# Patient Record
Sex: Female | Born: 1965 | Race: White | Hispanic: No | Marital: Married | Smoking: Former smoker
Health system: Southern US, Community
[De-identification: ages and names within clinical notes are randomized; demographics above are authoritative.]

## PROBLEM LIST (undated history)

## (undated) HISTORY — PX: APPENDECTOMY: SHX54

## (undated) HISTORY — PX: CHOLECYSTECTOMY: SHX55

## (undated) HISTORY — PX: ABDOMINAL HYSTERECTOMY: SHX81

## (undated) HISTORY — PX: FOOT SURGERY: SHX648

---

## 2015-11-17 DIAGNOSIS — N813 Complete uterovaginal prolapse: Secondary | ICD-10-CM | POA: Insufficient documentation

## 2016-03-15 ENCOUNTER — Ambulatory Visit (INDEPENDENT_AMBULATORY_CARE_PROVIDER_SITE_OTHER): Payer: Commercial Managed Care - PPO | Admitting: Family Medicine

## 2016-03-15 ENCOUNTER — Ambulatory Visit (INDEPENDENT_AMBULATORY_CARE_PROVIDER_SITE_OTHER): Payer: Commercial Managed Care - PPO

## 2016-03-15 ENCOUNTER — Encounter: Payer: Self-pay | Admitting: Family Medicine

## 2016-03-15 VITALS — BP 128/82 | HR 70 | Ht 67.0 in | Wt 220.0 lb

## 2016-03-15 DIAGNOSIS — M25551 Pain in right hip: Secondary | ICD-10-CM | POA: Diagnosis not present

## 2016-03-15 DIAGNOSIS — I951 Orthostatic hypotension: Secondary | ICD-10-CM | POA: Diagnosis not present

## 2016-03-15 DIAGNOSIS — R002 Palpitations: Secondary | ICD-10-CM | POA: Insufficient documentation

## 2016-03-15 LAB — COMPREHENSIVE METABOLIC PANEL
ALBUMIN: 4 g/dL (ref 3.6–5.1)
ALT: 14 U/L (ref 6–29)
AST: 14 U/L (ref 10–35)
Alkaline Phosphatase: 86 U/L (ref 33–115)
BILIRUBIN TOTAL: 0.2 mg/dL (ref 0.2–1.2)
BUN: 11 mg/dL (ref 7–25)
CALCIUM: 8.9 mg/dL (ref 8.6–10.2)
CHLORIDE: 108 mmol/L (ref 98–110)
CO2: 21 mmol/L (ref 20–31)
CREATININE: 0.82 mg/dL (ref 0.50–1.10)
Glucose, Bld: 84 mg/dL (ref 65–99)
Potassium: 4.5 mmol/L (ref 3.5–5.3)
SODIUM: 139 mmol/L (ref 135–146)
TOTAL PROTEIN: 6.9 g/dL (ref 6.1–8.1)

## 2016-03-15 LAB — CBC
HEMATOCRIT: 40.3 % (ref 35.0–45.0)
HEMOGLOBIN: 13.3 g/dL (ref 11.7–15.5)
MCH: 28.9 pg (ref 27.0–33.0)
MCHC: 33 g/dL (ref 32.0–36.0)
MCV: 87.4 fL (ref 80.0–100.0)
MPV: 10 fL (ref 7.5–12.5)
Platelets: 314 10*3/uL (ref 140–400)
RBC: 4.61 MIL/uL (ref 3.80–5.10)
RDW: 14.1 % (ref 11.0–15.0)
WBC: 8.7 10*3/uL (ref 3.8–10.8)

## 2016-03-15 LAB — TSH: TSH: 2.38 mIU/L

## 2016-03-15 LAB — HEMOGLOBIN A1C
HEMOGLOBIN A1C: 5.6 % (ref <5.7)
MEAN PLASMA GLUCOSE: 114 mg/dL

## 2016-03-15 NOTE — Progress Notes (Signed)
       Susan Mcintosh is a 50 y.o. female who [name] [facility] today for establish care and discuss lightheadedness and dizziness. Patient is a 42 history of lightheadedness dizziness and headache. Symptoms occur when she stands. She notes some palpitations but denies any chest pain. No fevers chills vomiting or diarrhea. She measured her blood pressure was 130s over 90s.  Additionally she notes right groin pain. Pain is present for a few months. Pain occurring following hysterectomy. She denies any injury. Pain is worse in the groin with hip flexion and motion. She has difficulty tying her shoes and sometimes difficulty getting in and out of cars. She's tried some over-the-counter medicines which have helped a bit.   History reviewed. No pertinent past medical history. Past Surgical History  Procedure Laterality Date  . Abdominal hysterectomy    . Appendectomy    . Cholecystectomy    . Foot surgery Right    Social History  Substance Use Topics  . Smoking status: Former Games developermoker  . Smokeless tobacco: Not on file  . Alcohol Use: Not on file   family history includes Cancer in her maternal grandfather and paternal uncle; Heart disease in her father; Stroke in her father.  ROS as above: No  visual changes, nausea, vomiting, diarrhea, constipation, dizziness, abdominal pain, skin rash, fevers, chills, night sweats, weight loss, swollen lymph nodes, body aches, joint swelling, muscle aches, chest pain, shortness of breath, mood changes, visual or auditory hallucinations.   Medications: Current Outpatient Prescriptions  Medication Sig Dispense Refill  . cyanocobalamin 1000 MCG tablet Take by mouth.    . Omega-3 Fatty Acids (FISH OIL) 1000 MG CAPS Take by mouth.     No current facility-administered medications for this visit.   Allergies  Allergen Reactions  . Ibuprofen Rash    IV  ROUTE CAN TAKE ORAL   . Sulfamethoxazole Nausea Only and Rash     Exam:  BP 128/82 mmHg  Pulse 70  Ht 5\' 7"  (1.702 m)  Wt 220 lb (99.791 kg)  BMI 34.45 kg/m2  Orthostatic VS for the past 24 hrs:  BP- Lying Pulse- Lying BP- Sitting Pulse- Sitting BP- Standing at 0 minutes Pulse- Standing at 0 minutes  03/15/16 0943 124/72 mmHg 63 128/82 mmHg 70 114/86 mmHg 87      Gen: Well NAD HEENT: EOMI,  MMM Lungs: Normal work of breathing. CTABL Heart: RRR no MRG Abd: NABS, Soft. Nondistended, Nontender Exts: Brisk capillary refill, warm and well perfused.  Hips: Nontender throughout. Right hip motion limited in flexion and internal and external rotation by pain. Left hip motion is normal.   Twelve-lead EKG shows sinus bradycardia at 53 bpm. No ST segment elevation or depression. Aside from bradycardia normal EKG.  No results found for this or any previous visit (from the past 24 hour(s)). No results found.   Please see individual assessment and plan sections.

## 2016-03-15 NOTE — Assessment & Plan Note (Signed)
Likely DJD. Hip x-ray pending. Return in a few weeks.

## 2016-03-15 NOTE — Assessment & Plan Note (Signed)
Holter monitor pending

## 2016-03-15 NOTE — Patient Instructions (Addendum)
Thank you for coming in today. Get labs and xray today.  Return for fasting lab in the future.  Return a week after the holter monitor is done.  You should be contacted about he holter monitor soon.  Call or go to the emergency room if you get worse, have trouble breathing, have chest pains, or palpitations.   Orthostatic Hypotension Orthostatic hypotension is a sudden drop in blood pressure. It happens when you quickly stand up from a seated or lying position. You may feel dizzy or light-headed. This can last for just a few seconds or for up to a few minutes. It is usually not a serious problem. However, if this happens frequently or gets worse, it can be a sign of something more serious. CAUSES  Different things can cause orthostatic hypotension, including:   Loss of body fluids (dehydration).  Medicines that lower blood pressure.  Sudden changes in posture, such as standing up quickly after you have been sitting or lying down.  Taking too much of your medicine. SIGNS AND SYMPTOMS   Light-headedness or dizziness.   Fainting or near-fainting.   A fast heart rate.   Weakness.   Feeling tired (fatigue).  DIAGNOSIS  Your health care provider may do several things to help diagnose your condition and identify the cause. These may include:   Taking a medical history and doing a physical exam.  Checking your blood pressure. Your health care provider will check your blood pressure when you are:  Lying down.  Sitting.  Standing.  Using tilt table testing. In this test, you lie down on a table that moves from a lying position to a standing position. You will be strapped onto the table. This test monitors your blood pressure and heart rate when you are in different positions. TREATMENT  Treatment will vary depending on the cause. Possible treatments include:   Changing the dosage of your medicines.  Wearing compression stockings on your lower legs.  Standing up slowly  after sitting or lying down.  Eating more salt.  Eating frequent, small meals.  In some cases, getting IV fluids.  Taking medicine to enhance fluid retention. HOME CARE INSTRUCTIONS  Only take over-the-counter or prescription medicines as directed by your health care provider.  Follow your health care provider's instructions for changing the dosage of your current medicines.  Do not stop or adjust your medicine on your own.  Stand up slowly after sitting or lying down. This allows your body to adjust to the different position.  Wear compression stockings as directed.  Eat extra salt as directed.  Do not add extra salt to your diet unless directed to by your health care provider.  Eat frequent, small meals.  Avoid standing suddenly after eating.  Avoid hot showers or excessive heat as directed by your health care provider.  Keep all follow-up appointments. SEEK MEDICAL CARE IF:  You continue to feel dizzy or light-headed after standing.  You feel groggy or confused.  You feel cold, clammy, or sick to your stomach (nauseous).  You have blurred vision.  You feel short of breath. SEEK IMMEDIATE MEDICAL CARE IF:   You faint after standing.  You have chest pain.  You have difficulty breathing.   You lose feeling or movement in your arms or legs.   You have slurred speech or difficulty talking, or you are unable to talk.  MAKE SURE YOU:   Understand these instructions.  Will watch your condition.  Will get help right away  if you are not doing well or get worse.   This information is not intended to replace advice given to you by your health care provider. Make sure you discuss any questions you have with your health care provider.   Document Released: 11/15/2002 Document Revised: 11/30/2013 Document Reviewed: 09/17/2013 Elsevier Interactive Patient Education [employer]2016 Elsevier Inc.

## 2016-03-15 NOTE — Assessment & Plan Note (Signed)
Patient has orthostatic vital signs. This likely explains her lightheadedness. Plan for basic laboratory evaluation as well as Holter monitor. Additionally we'll obtain fasting labs. Recheck in a week after Holter monitor is done. Return sooner if needed. Eat salty foods.

## 2016-03-16 LAB — VITAMIN D 25 HYDROXY (VIT D DEFICIENCY, FRACTURES): Vit D, 25-Hydroxy: 41 ng/mL (ref 30–100)

## 2016-03-18 NOTE — Progress Notes (Signed)
Quick Note:  Hip xray does not show a lot of arthritis. We should consider PT or injection. ______

## 2016-03-19 ENCOUNTER — Encounter (HOSPITAL_COMMUNITY): Payer: Self-pay | Admitting: Emergency Medicine

## 2016-03-19 ENCOUNTER — Emergency Department (HOSPITAL_COMMUNITY): Payer: Commercial Managed Care - PPO

## 2016-03-19 ENCOUNTER — Emergency Department (HOSPITAL_COMMUNITY)
Admission: EM | Admit: 2016-03-19 | Discharge: 2016-03-19 | Disposition: A | Discharge status: Home / Self Care | Payer: Commercial Managed Care - PPO | Attending: Emergency Medicine | Admitting: Emergency Medicine

## 2016-03-19 DIAGNOSIS — R110 Nausea: Secondary | ICD-10-CM | POA: Insufficient documentation

## 2016-03-19 DIAGNOSIS — Z87891 Personal history of nicotine dependence: Secondary | ICD-10-CM | POA: Diagnosis not present

## 2016-03-19 DIAGNOSIS — R51 Headache: Secondary | ICD-10-CM | POA: Diagnosis present

## 2016-03-19 DIAGNOSIS — Z79899 Other long term (current) drug therapy: Secondary | ICD-10-CM | POA: Insufficient documentation

## 2016-03-19 DIAGNOSIS — R519 Headache, unspecified: Secondary | ICD-10-CM

## 2016-03-19 LAB — BASIC METABOLIC PANEL WITH GFR
Anion gap: 12 (ref 5–15)
BUN: 8 mg/dL (ref 6–20)
CO2: 20 mmol/L — ABNORMAL LOW (ref 22–32)
Calcium: 9.3 mg/dL (ref 8.9–10.3)
Chloride: 108 mmol/L (ref 101–111)
Creatinine, Ser: 0.74 mg/dL (ref 0.44–1.00)
GFR calc Af Amer: >60 mL/min
GFR calc non Af Amer: >60 mL/min
Glucose, Bld: 93 mg/dL (ref 65–99)
Potassium: 3.8 mmol/L (ref 3.5–5.1)
Sodium: 140 mmol/L (ref 135–145)

## 2016-03-19 LAB — CBC
HEMATOCRIT: 39.6 % (ref 36.0–46.0)
HEMOGLOBIN: 12.9 g/dL (ref 12.0–15.0)
MCH: 28.5 pg (ref 26.0–34.0)
MCHC: 32.6 g/dL (ref 30.0–36.0)
MCV: 87.4 fL (ref 78.0–100.0)
Platelets: 290 10*3/uL (ref 150–400)
RBC: 4.53 MIL/uL (ref 3.87–5.11)
RDW: 13.3 % (ref 11.5–15.5)
WBC: 7.6 10*3/uL (ref 4.0–10.5)

## 2016-03-19 MED ORDER — ONDANSETRON HCL 4 MG/2ML IJ SOLN
4.0000 mg | Freq: Once | INTRAMUSCULAR | Status: AC
  Administered 2016-03-19: 4 mg via INTRAVENOUS
  Filled 2016-03-19: qty 2

## 2016-03-19 MED ORDER — TRAMADOL HCL 50 MG PO TABS: 50.0000 mg | ORAL_TABLET | Freq: Four times a day (QID) | ORAL | Status: DC | PRN

## 2016-03-19 MED ORDER — ONDANSETRON 4 MG PO TBDP: ORAL_TABLET | ORAL | Status: DC

## 2016-03-19 MED ORDER — HYDROMORPHONE HCL 1 MG/ML IJ SOLN
0.5000 mg | Freq: Once | INTRAMUSCULAR | Status: AC
  Administered 2016-03-19: 0.5 mg via INTRAVENOUS
  Filled 2016-03-19: qty 1

## 2016-03-19 NOTE — Discharge Instructions (Signed)
Follow up with your md next week. °

## 2016-03-19 NOTE — ED Provider Notes (Signed)
CSN: [id number]     Arrival date & time 03/19/16  1140 History   First MD Initiated Contact with Patient 03/19/16 1708     Chief Complaint  Patient presents with  . Headache     (Consider location/radiation/quality/duration/timing/severity/associated sxs/prior Treatment) Patient is a 50 y.o. female presenting with headaches. The history is provided by the patient (Patient complains of a headache today at work with some nausea).  Headache Pain location:  Frontal Quality:  Dull Radiates to:  Does not radiate Severity currently:  7/10 Severity at highest:  8/10 Onset quality:  Sudden Timing:  Constant Progression:  Waxing and waning Chronicity:  New Associated symptoms: no abdominal pain, no back pain, no congestion, no cough, no diarrhea, no fatigue, no seizures and no sinus pressure     History reviewed. No pertinent past medical history. Past Surgical History  Procedure Laterality Date  . Abdominal hysterectomy    . Appendectomy    . Cholecystectomy    . Foot surgery Right    Family History  Problem Relation Age of Onset  . Stroke Father   . Heart disease Father   . Cancer Paternal Uncle   . Cancer Maternal Grandfather    Social History  Substance Use Topics  . Smoking status: Former Games developer  . Smokeless tobacco: None  . Alcohol Use: None   OB History    No data available     Review of Systems  Constitutional: Negative for appetite change and fatigue.  HENT: Negative for congestion, ear discharge and sinus pressure.   Eyes: Negative for discharge.  Respiratory: Negative for cough.   Cardiovascular: Negative for chest pain.  Gastrointestinal: Negative for abdominal pain and diarrhea.  Genitourinary: Negative for frequency and hematuria.  Musculoskeletal: Negative for back pain.  Skin: Negative for rash.  Neurological: Positive for headaches. Negative for seizures.  Psychiatric/Behavioral: Negative for hallucinations.      Allergies  Ibuprofen and  Sulfamethoxazole  Home Medications   Prior to Admission medications   Medication Sig Start Date End Date Taking? Authorizing Provider  acetaminophen (TYLENOL) 500 MG tablet Take 1,000 mg by mouth every 6 (six) hours as needed for mild pain.   Yes Historical Provider, MD  cyanocobalamin 1000 MCG tablet Take 500 mcg by mouth daily.    Yes Historical Provider, MD  Omega-3 Fatty Acids (FISH OIL) 1000 MG CAPS Take 1 capsule by mouth daily.    Yes Historical Provider, MD  ondansetron (ZOFRAN ODT) 4 MG disintegrating tablet  ODT q4 hours prn nausea/vomit 03/19/16   Bethann Berkshire, MD  traMADol (ULTRAM) 50 MG tablet Take 1 tablet (50 mg total) by mouth every 6 (six) hours as needed. 03/19/16   Bethann Berkshire, MD   BP 127/85 mmHg  Pulse 60  Resp 16  Ht  (1.702 m)  Wt 220 lb (99.791 kg)  BMI 34.45 kg/m2  SpO2 98% Physical Exam  Constitutional: She is oriented to person, place, and time. She appears well-developed.  HENT:  Head: Normocephalic.  Eyes: Conjunctivae and EOM are normal. No scleral icterus.  Neck: Neck supple. No thyromegaly present.  Cardiovascular: Normal rate and regular rhythm.  Exam reveals no gallop and no friction rub.   No murmur heard. Pulmonary/Chest: No stridor. She has no wheezes. She has no rales. She exhibits no tenderness.  Abdominal: She exhibits no distension. There is no tenderness. There is no rebound.  Musculoskeletal: Normal range of motion. She exhibits no edema.  Lymphadenopathy:    She has no  cervical adenopathy.  Neurological: She is oriented to person, place, and time. She exhibits normal muscle tone. Coordination normal.  Skin: No rash noted. No erythema.  Psychiatric: She has a normal mood and affect. Her behavior is normal.    ED Course  Procedures (including critical care time) Labs Review Labs Reviewed  BASIC METABOLIC PANEL - Abnormal; Notable for the following:    CO2 20 (*)    All other components within normal limits  CBC     Imaging Review Ct Head Wo Contrast  03/19/2016  CLINICAL DATA:  Headache for 2 weeks with neck pain EXAM: CT HEAD WITHOUT CONTRAST CT CERVICAL SPINE WITHOUT CONTRAST TECHNIQUE: Multidetector CT imaging of the head and cervical spine was performed following the standard protocol without intravenous contrast. Multiplanar CT image reconstructions of the cervical spine were also generated. COMPARISON:  None. FINDINGS: CT HEAD FINDINGS There is no evidence of mass effect, midline shift or extra-axial fluid collections. There is no evidence of a space-occupying lesion or intracranial hemorrhage. There is no evidence of a cortical-based area of acute infarction. The ventricles and sulci are appropriate for the patient's age. The basal cisterns are patent. Visualized portions of the orbits are unremarkable. The visualized portions of the paranasal sinuses and mastoid air cells are unremarkable. The osseous structures are unremarkable. CT CERVICAL SPINE FINDINGS The alignment is anatomic. The vertebral body heights are maintained. There is no acute fracture. There is no static listhesis. The prevertebral soft tissues are normal. The intraspinal soft tissues are not fully imaged on this examination due to poor soft tissue contrast, but there is no gross soft tissue abnormality. There is an accessory ossicle adjacent to the tip of the odontoid. There is degenerative disc disease with disc height loss at C4-5, C5-6 and C6-7. There bilateral uncovertebral degenerative changes at C4-5 and C5-6. There is a mild broad-based disc bulge C6-7. The visualized portions of the lung apices demonstrate no focal abnormality. IMPRESSION: 1. No acute intracranial pathology. 2. No acute osseous injury of the cervical spine. Electronically Signed   By: Elige KoHetal  Patel   On: 03/19/2016 20:05   Ct Cervical Spine Wo Contrast  03/19/2016  CLINICAL DATA:  Headache for 2 weeks with neck pain EXAM: CT HEAD WITHOUT CONTRAST CT CERVICAL SPINE  WITHOUT CONTRAST TECHNIQUE: Multidetector CT imaging of the head and cervical spine was performed following the standard protocol without intravenous contrast. Multiplanar CT image reconstructions of the cervical spine were also generated. COMPARISON:  None. FINDINGS: CT HEAD FINDINGS There is no evidence of mass effect, midline shift or extra-axial fluid collections. There is no evidence of a space-occupying lesion or intracranial hemorrhage. There is no evidence of a cortical-based area of acute infarction. The ventricles and sulci are appropriate for the patient's age. The basal cisterns are patent. Visualized portions of the orbits are unremarkable. The visualized portions of the paranasal sinuses and mastoid air cells are unremarkable. The osseous structures are unremarkable. CT CERVICAL SPINE FINDINGS The alignment is anatomic. The vertebral body heights are maintained. There is no acute fracture. There is no static listhesis. The prevertebral soft tissues are normal. The intraspinal soft tissues are not fully imaged on this examination due to poor soft tissue contrast, but there is no gross soft tissue abnormality. There is an accessory ossicle adjacent to the tip of the odontoid. There is degenerative disc disease with disc height loss at C4-5, C5-6 and C6-7. There bilateral uncovertebral degenerative changes at C4-5 and C5-6. There is a mild  broad-based disc bulge C6-7. The visualized portions of the lung apices demonstrate no focal abnormality. IMPRESSION: 1. No acute intracranial pathology. 2. No acute osseous injury of the cervical spine. Electronically Signed   By: Elige Ko   On: 03/19/2016 20:05   I have personally reviewed and evaluated these images and lab results as part of my medical decision-making.   EKG Interpretation None      MDM   Final diagnoses:  Headache behind the eyes   Patient with a headache today that seems to be improving now. Labs and CT scan are negative.      Bethann Berkshire, MD 03/19/16 2035

## 2016-03-19 NOTE — ED Notes (Signed)
Pt states she was at work and started feeling dizzy like she was going to pass out and she had a severe headache. Pt states her bp is elevated, but no hx htn

## 2016-03-19 NOTE — Progress Notes (Signed)
Quick Note:  Normal, no changes. ______ 

## 2016-03-19 NOTE — ED Notes (Signed)
Pt verbalized understanding of d/c instructions and has no further questions. Pt stable and NAD. BP has remained WNL the entire visit.

## 2016-03-26 ENCOUNTER — Ambulatory Visit (INDEPENDENT_AMBULATORY_CARE_PROVIDER_SITE_OTHER): Payer: Commercial Managed Care - PPO

## 2016-03-26 DIAGNOSIS — R002 Palpitations: Secondary | ICD-10-CM | POA: Diagnosis not present

## 2016-04-05 ENCOUNTER — Ambulatory Visit (INDEPENDENT_AMBULATORY_CARE_PROVIDER_SITE_OTHER): Payer: Commercial Managed Care - PPO | Admitting: Family Medicine

## 2016-04-05 ENCOUNTER — Encounter: Payer: Self-pay | Admitting: Family Medicine

## 2016-04-05 VITALS — BP 122/86 | HR 80 | Wt 224.0 lb

## 2016-04-05 DIAGNOSIS — R002 Palpitations: Secondary | ICD-10-CM | POA: Diagnosis not present

## 2016-04-05 DIAGNOSIS — M542 Cervicalgia: Secondary | ICD-10-CM

## 2016-04-05 DIAGNOSIS — I951 Orthostatic hypotension: Secondary | ICD-10-CM | POA: Diagnosis not present

## 2016-04-05 DIAGNOSIS — F339 Major depressive disorder, recurrent, unspecified: Secondary | ICD-10-CM | POA: Insufficient documentation

## 2016-04-05 DIAGNOSIS — F331 Major depressive disorder, recurrent, moderate: Secondary | ICD-10-CM | POA: Diagnosis not present

## 2016-04-05 LAB — COMPREHENSIVE METABOLIC PANEL
ALBUMIN: 4 g/dL (ref 3.6–5.1)
ALT: 29 U/L (ref 6–29)
AST: 25 U/L (ref 10–35)
Alkaline Phosphatase: 101 U/L (ref 33–115)
BUN: 13 mg/dL (ref 7–25)
CALCIUM: 9 mg/dL (ref 8.6–10.2)
CHLORIDE: 105 mmol/L (ref 98–110)
CO2: 21 mmol/L (ref 20–31)
CREATININE: 0.8 mg/dL (ref 0.50–1.10)
Glucose, Bld: 99 mg/dL (ref 65–99)
POTASSIUM: 4.6 mmol/L (ref 3.5–5.3)
Sodium: 140 mmol/L (ref 135–146)
TOTAL PROTEIN: 7 g/dL (ref 6.1–8.1)
Total Bilirubin: 0.3 mg/dL (ref 0.2–1.2)

## 2016-04-05 LAB — CBC
HEMATOCRIT: 38.8 % (ref 35.0–45.0)
HEMOGLOBIN: 12.9 g/dL (ref 11.7–15.5)
MCH: 29 pg (ref 27.0–33.0)
MCHC: 33.2 g/dL (ref 32.0–36.0)
MCV: 87.2 fL (ref 80.0–100.0)
MPV: 9 fL (ref 7.5–12.5)
Platelets: 266 10*3/uL (ref 140–400)
RBC: 4.45 MIL/uL (ref 3.80–5.10)
RDW: 13.9 % (ref 11.0–15.0)
WBC: 7 10*3/uL (ref 3.8–10.8)

## 2016-04-05 LAB — TSH: TSH: 2 mIU/L

## 2016-04-05 LAB — LUTEINIZING HORMONE: LH: 31.1 m[IU]/mL

## 2016-04-05 LAB — T3, FREE: T3 FREE: 2.9 pg/mL (ref 2.3–4.2)

## 2016-04-05 LAB — PROGESTERONE

## 2016-04-05 LAB — FOLLICLE STIMULATING HORMONE: FSH: 44 m[IU]/mL

## 2016-04-05 LAB — T4, FREE: FREE T4: 0.9 ng/dL (ref 0.8–1.8)

## 2016-04-05 MED ORDER — FLUOXETINE HCL 10 MG PO CAPS: 10.0000 mg | ORAL_CAPSULE | Freq: Every day | ORAL | Status: DC

## 2016-04-05 NOTE — Assessment & Plan Note (Signed)
Patient has flushed feelings with palpitations and some orthostasis. Her monitor is pending. This may be menopausal or perimenopausal symptoms. Check estrogen progesterone FSH and LH. Additionally check thyroid labs.

## 2016-04-05 NOTE — Progress Notes (Signed)
       Susan Mcintosh is a 50 y.o. female who [name] [facility] Care today for follow-up palpitations. Patient was seen recently for palpitations/feeling flushed. She had an outpatient Holter monitor which is been done but the results are not back yet. She notes daily palpitations. No fevers or chills vomiting or diarrhea. She does note worsening affect and mood. She notes feeling down and depressed or hopeless and trouble falling asleep feeling bad about herself and as though she is a failure and difficulty concentrating. She notes as though has some overeating as well. She notes the past she had depression and was treated with Prozac and tolerated it well. Patient notes neck soreness and pain. She has pain in her posterior neck and spreading to her upper shoulders. Stenosis is worse with work and better with rest. No radiating pain weakness or numbness.   No past medical history on file. Past Surgical History  Procedure Laterality Date  . Abdominal hysterectomy    . Appendectomy    . Cholecystectomy    . Foot surgery Right    Social History  Substance Use Topics  . Smoking status: Former Games developermoker  . Smokeless tobacco: Not on file  . Alcohol Use: Not on file   family history includes Cancer in her maternal grandfather and paternal uncle; Heart disease in her father; Stroke in her father.  ROS as above Medications: Current Outpatient Prescriptions  Medication Sig Dispense Refill  . acetaminophen (TYLENOL) 500 MG tablet Take 1,000 mg by mouth every 6 (six) hours as needed for mild pain.    . cyanocobalamin 1000 MCG tablet Take 500 mcg by mouth daily.     . Omega-3 Fatty Acids (FISH OIL) 1000 MG CAPS Take 1 capsule by mouth daily.     Marland Kitchen. FLUoxetine (PROZAC) 10 MG capsule Take 1 capsule (10 mg total) by mouth daily. 30 capsule 0   No current facility-administered medications for this visit.     Allergies  Allergen Reactions  . Ibuprofen Rash    IV ROUTE CAN TAKE ORAL   . Sulfamethoxazole Nausea Only and Rash     Exam:  BP 122/86 mmHg  Pulse 80  Wt 224 lb (101.606 kg) Gen: Well NAD HEENT: EOMI,  MMM Lungs: Normal work of breathing. CTABL Heart: RRR no MRG Abd: NABS, Soft. Nondistended, Nontender Exts: Brisk capillary refill, warm and well perfused.  Psych: Alert and oriented normal speech thought process and tearful affect.  Spine: Nontender to spinal midline normal neck motion. Extremity strength and sensation are equal and normal bilaterally.  PHQ9: 20 GAD7: 17  No results found for this or any previous visit (from the past 24 hour(s)). No results found.   Please see individual assessment and plan sections.

## 2016-04-05 NOTE — Assessment & Plan Note (Signed)
Patient is displaying symptoms of major depression and anxiety. Start Prozac and recheck in 2 weeks

## 2016-04-05 NOTE — Patient Instructions (Addendum)
Thank you for coming in today. We are getting labs today  Start prozac daily and follow up in 1-2 weeks.  Come back or go to the emergency room if you notice new weakness new numbness problems walking or bowel or bladder problems. We are referring to PT for neck pain.  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

## 2016-04-05 NOTE — Assessment & Plan Note (Signed)
Likely DDD seen on CT scan. Plan for referral to physical therapy and recheck in 2 weeks

## 2016-04-08 NOTE — Progress Notes (Signed)
Quick Note:  Holter monitor shows nothing scary. We will talk about it more at the next visit. ______

## 2016-04-10 LAB — ESTROGENS, TOTAL: ESTROGEN: 245.8 pg/mL

## 2016-04-12 ENCOUNTER — Ambulatory Visit (INDEPENDENT_AMBULATORY_CARE_PROVIDER_SITE_OTHER): Payer: Commercial Managed Care - PPO | Admitting: Family Medicine

## 2016-04-12 ENCOUNTER — Ambulatory Visit: Payer: Commercial Managed Care - PPO | Admitting: Rehabilitative and Restorative Service Providers"

## 2016-04-12 ENCOUNTER — Encounter: Payer: Self-pay | Admitting: Family Medicine

## 2016-04-12 VITALS — BP 140/86 | HR 79 | Wt 223.0 lb

## 2016-04-12 DIAGNOSIS — F331 Major depressive disorder, recurrent, moderate: Secondary | ICD-10-CM | POA: Diagnosis not present

## 2016-04-12 DIAGNOSIS — Z780 Asymptomatic menopausal state: Secondary | ICD-10-CM | POA: Diagnosis not present

## 2016-04-12 DIAGNOSIS — R002 Palpitations: Secondary | ICD-10-CM | POA: Diagnosis not present

## 2016-04-12 MED ORDER — FLUOXETINE HCL 20 MG PO CAPS: 20.0000 mg | ORAL_CAPSULE | Freq: Every day | ORAL | Status: DC

## 2016-04-12 NOTE — Progress Notes (Signed)
Quick Note:  Labs show perimenopause but not full menopause. ______

## 2016-04-12 NOTE — Assessment & Plan Note (Signed)
Normal Holter monitor Symptoms are improving.

## 2016-04-12 NOTE — Assessment & Plan Note (Signed)
Symptoms are likely postmenopausal. Discussed options including hormone replacement therapy. Patient would like to Black cohosh instead.

## 2016-04-12 NOTE — Progress Notes (Signed)
       Susan AlandKristie Mcintosh is a 50 y.o. female who [facility]: Primary Care today for follow-up anxiety and depression. Patient was seen last week and thought to have depression symptoms. She was started on Prozac and notes improvement. She denies any fevers or chills nausea vomiting or diarrhea. Additionally she had a workup for possible menopausal symptoms. This showed relatively low estrogen very low progesterone and elevated LH and FSH levels.  Additionally patient had palpitations and was worked up with a Holter monitor which was normal. She notes her palpitations are slightly improved with Prozac.   No past medical history on file. Past Surgical History  Procedure Laterality Date  . Abdominal hysterectomy    . Appendectomy    . Cholecystectomy    . Foot surgery Right    Social History  Substance Use Topics  . Smoking status: Former Games developermoker  . Smokeless tobacco: Not on file  . Alcohol Use: Not on file   family history includes Cancer in her maternal grandfather and paternal uncle; Heart disease in her father; Stroke in her father.  ROS as above Medications: Current Outpatient Prescriptions  Medication Sig Dispense Refill  . acetaminophen (TYLENOL) 500 MG tablet Take 1,000 mg by mouth every 6 (six) hours as needed for mild pain.    . cyanocobalamin 1000 MCG tablet Take 500 mcg by mouth daily.     Marland Kitchen. FLUoxetine (PROZAC) 20 MG capsule Take 1 capsule (20 mg total) by mouth daily. 30 capsule 0  . Omega-3 Fatty Acids (FISH OIL) 1000 MG CAPS Take 1 capsule by mouth daily.      No current facility-administered medications for this visit.   Allergies  Allergen Reactions  . Ibuprofen Rash    IV ROUTE CAN TAKE ORAL   . Sulfamethoxazole Nausea Only and Rash     Exam:  BP 140/86 mmHg  Pulse 79  Wt 223 lb (101.152 kg) Gen: Well NAD HEENT: EOMI,  MMM Lungs: Normal work of breathing.  CTABL Heart: RRR no MRG Abd: NABS, Soft. Nondistended, Nontender Exts: Brisk capillary refill, warm and well perfused.  Psych: Alert and oriented normal speech thought process and affect.  No results found for this or any previous visit (from the past 24 hour(s)). No results found.   Please see individual assessment and plan sections.

## 2016-04-12 NOTE — Patient Instructions (Signed)
Thank you for coming in today. Increase prozac to 20mg  daily.  Return in 2-3 weeks.   You can use black cohosh for menopause symptoms  Menopause and Herbal Products WHAT IS MENOPAUSE? Menopause is the normal time of life when menstrual periods decrease in frequency and eventually stop completely. This process can take several years for some women. Menopause is complete when you have had an absence of menstruation for a full year since your last menstrual period. It usually occurs between the ages of 6748 and 7655. It is not common for menopause to begin before the age of 48. During menopause, your body stops producing the female hormones estrogen and progesterone. Common symptoms associated with this loss of hormones (vasomotor symptoms) are:  Hot flashes.  Hot flushes.  Night sweats. Other common symptoms and complications of menopause include:  Decrease in sex drive.  Vaginal dryness and thinning of the walls of the vagina. This can make sex painful.  Dryness of the skin and development of wrinkles.  Headaches.  Tiredness.  Irritability.  Memory problems.  Weight gain.  Bladder infections.  Hair growth on the face and chest.  Inability to reproduce offspring (infertility).  Loss of density in the bones (osteoporosis) increasing your risk for breaks (fractures).  Depression.  Hardening and narrowing of the arteries (atherosclerosis). This increases your risk of heart attack and stroke. WHAT TREATMENT OPTIONS ARE AVAILABLE? There are many treatment choices for menopause symptoms. The most common treatment is hormone replacement therapy. Many alternative therapies for menopause are emerging, including the use of herbal products. These supplements can be found in the form of herbs, teas, oils, tinctures, and pills. Common herbal supplements for menopause are made from plants that contain phytoestrogens. Phytoestrogens are compounds that occur naturally in plants and plant  products. They act like estrogen in the body. Foods and herbs that contain phytoestrogens include:  Soy.  Flax seeds.  Red clover.  Ginseng. WHAT MENOPAUSE SYMPTOMS MAY BE HELPED IF I USE HERBAL PRODUCTS?  Vasomotor symptoms. These may be helped by:  Soy. Some studies show that soy may have a moderate benefit for hot flashes.  Black cohosh. There is limited evidence indicating this may be beneficial for hot flashes.  Symptoms that are related to heart and blood vessel disease. These may be helped by soy. Studies have shown that soy can help to lower cholesterol.  Depression. This may be helped by:  St. John's wort. There is limited evidence that shows this may help mild to moderate depression.  Black cohosh. There is evidence that this may help depression and mood swings.  Osteoporosis. Soy may help to decrease bone loss that is associated with menopause and may prevent osteoporosis. Limited evidence indicates that red clover may offer some bone loss protection as well. Other herbal products that are commonly used during menopause lack enough evidence to support their use as a replacement for conventional menopause therapies. These products include evening primrose, ginseng, and red clover. WHAT ARE THE CASES WHEN HERBAL PRODUCTS SHOULD NOT BE USED DURING MENOPAUSE? Do not use herbal products during menopause without your health care provider's approval if:  You are taking medicine.  You have a preexisting liver condition. ARE THERE ANY RISKS IN MY TAKING HERBAL PRODUCTS DURING MENOPAUSE? If you choose to use herbal products to help with symptoms of menopause, keep in mind that:  Different supplements have different and unmeasured amounts of herbal ingredients.  Herbal products are not regulated the same way that medicines are.  Concentrations of herbs may vary depending on the way they are prepared. For example, the concentration may be different in a pill, tea, oil, and  tincture.  Little is known about the risks of using herbal products, particularly the risks of long-term use.  Some herbal supplements can be harmful when combined with certain medicines. Most commonly reported side effects of herbal products are mild. However, if used improperly, many herbal supplements can cause serious problems. Talk to your health care provider before starting any herbal product. If problems develop, stop taking the supplement and let your health care provider know.   This information is not intended to replace advice given to you by your health care provider. Make sure you discuss any questions you have with your health care provider.   Document Released: 05/13/2008 Document Revised: 12/16/2014 Document Reviewed: 05/10/2014 Elsevier Interactive Patient Education 2016 Elsevier Inc.  Myofascial Pain Syndrome and Fibromyalgia Myofascial pain syndrome and fibromyalgia are both pain disorders. This pain may be felt mainly in your muscles.   Myofascial pain syndrome:  Always has trigger points or tender points in the muscle that will cause pain when pressed. The pain may come and go.  Usually affects your neck, upper back, and shoulder areas. The pain often radiates into your arms and hands.  Fibromyalgia:  Has muscle pains and tenderness that come and go.  Is often associated with fatigue and sleep disturbances.  Has trigger points.  Tends to be long-lasting (chronic), but is not life-threatening. Fibromyalgia and myofascial pain are not the same. However, they often occur together. If you have both conditions, each can make the other worse. Both are common and can cause enough pain and fatigue to make day-to-day activities difficult.  CAUSES  The exact causes of fibromyalgia and myofascial pain are not known. People with certain gene types may be more likely to develop fibromyalgia. Some factors can be triggers for both conditions, such as:   Spine  disorders.  Arthritis.  Severe injury (trauma) and other physical stressors.  Being under a lot of stress.  A medical illness. SIGNS AND SYMPTOMS  Fibromyalgia The main symptom of fibromyalgia is widespread pain and tenderness in your muscles. This can vary over time. Pain is sometimes described as stabbing, shooting, or burning. You may have tingling or numbness, too. You may also have sleep problems and fatigue. You may wake up feeling tired and groggy (fibro fog). Other symptoms may include:   Bowel and bladder problems.  Headaches.  Visual problems.  Problems with odors and noises.  Depression or mood changes.  Painful menstrual periods (dysmenorrhea).  Dry skin or eyes. Myofascial pain syndrome Symptoms of myofascial pain syndrome include:   Tight, ropy bands of muscle.   Uncomfortable sensations in muscular areas, such as:  Aching.  Cramping.  Burning.  Numbness.  Tingling.   Muscle weakness.  Trouble moving certain muscles freely (range of motion). DIAGNOSIS  There are no specific tests to diagnose fibromyalgia or myofascial pain syndrome. Both can be hard to diagnose because their symptoms are common in many other conditions. Your health care provider may suspect one or both of these conditions based on your symptoms and medical history. Your health care provider will also do a physical exam.  The key to diagnosing fibromyalgia is having pain, fatigue, and other symptoms for more than three months that cannot be explained by another condition.  The key to diagnosing myofascial pain syndrome is finding trigger points in muscles that are tender and  cause pain elsewhere in your body (referred pain). TREATMENT  Treating fibromyalgia and myofascial pain often requires a team of health care providers. This usually starts with your primary provider and a physical therapist. You may also find it helpful to work with alternative health care providers, such as  massage therapists or acupuncturists. Treatment for fibromyalgia may include medicines. This may include nonsteroidal anti-inflammatory drugs (NSAIDs), along with other medicines.  Treatment for myofascial pain may also include:  NSAIDs.  Cooling and stretching of muscles.  Trigger point injections.  Sound wave (ultrasound) treatments to stimulate muscles. HOME CARE INSTRUCTIONS   Take medicines only as directed by your health care provider.  Exercise as directed by your health care provider or physical therapist.  Try to avoid stressful situations.  Practice relaxation techniques to control your stress. You may want to try:  Biofeedback.  Visual imagery.  Hypnosis.  Muscle relaxation.  Yoga.  Meditation.  Talk to your health care provider about alternative treatments, such as acupuncture or massage treatment.  Maintain a healthy lifestyle. This includes eating a healthy diet and getting enough sleep.  Consider joining a support group.  Do not do activities that stress or strain your muscles. That includes repetitive motions and heavy lifting. SEEK MEDICAL CARE IF:   You have new symptoms.  Your symptoms get worse.  You have side effects from your medicines.  You have trouble sleeping.  Your condition is causing depression or anxiety. FOR MORE INFORMATION   National Fibromyalgia Association: http://www.fmaware.orgwww.fmaware.org  Arthritis Foundation: http://www.arthritis.orgwww.arthritis.org  American Chronic Pain Association: michaeledo.com.CandyDash.co.za   This information is not intended to replace advice given to you by your health care provider. Make sure you discuss any questions you have with your health care provider.   Document Released: 11/25/2005 Document Revised: 12/16/2014 Document Reviewed: 08/31/2014 Elsevier Interactive Patient Education [employer]

## 2016-04-12 NOTE — Assessment & Plan Note (Signed)
Improving but still quite bad. Increase fluoxetine to 20 mg. Recheck in 2-3 weeks.

## 2016-05-03 ENCOUNTER — Encounter: Payer: Self-pay | Admitting: Family Medicine

## 2016-05-03 ENCOUNTER — Ambulatory Visit: Payer: Commercial Managed Care - PPO | Admitting: Osteopathic Medicine

## 2016-05-03 ENCOUNTER — Ambulatory Visit (INDEPENDENT_AMBULATORY_CARE_PROVIDER_SITE_OTHER): Payer: Commercial Managed Care - PPO | Admitting: Family Medicine

## 2016-05-03 VITALS — BP 146/78 | HR 79 | Wt 221.0 lb

## 2016-05-03 DIAGNOSIS — R030 Elevated blood-pressure reading, without diagnosis of hypertension: Secondary | ICD-10-CM

## 2016-05-03 DIAGNOSIS — F325 Major depressive disorder, single episode, in full remission: Secondary | ICD-10-CM | POA: Diagnosis not present

## 2016-05-03 DIAGNOSIS — H9193 Unspecified hearing loss, bilateral: Secondary | ICD-10-CM | POA: Diagnosis not present

## 2016-05-03 DIAGNOSIS — H9190 Unspecified hearing loss, unspecified ear: Secondary | ICD-10-CM | POA: Insufficient documentation

## 2016-05-03 DIAGNOSIS — Z780 Asymptomatic menopausal state: Secondary | ICD-10-CM

## 2016-05-03 DIAGNOSIS — IMO0001 Reserved for inherently not codable concepts without codable children: Secondary | ICD-10-CM

## 2016-05-03 MED ORDER — FLUOXETINE HCL 20 MG PO CAPS: 20.0000 mg | ORAL_CAPSULE | Freq: Every day | ORAL | Status: DC

## 2016-05-03 NOTE — Progress Notes (Signed)
       Susan Mcintosh is a 50 y.o. female [facility]: [facility] today for decreased hearing, follow-up depression, postmenopausal symptoms, elevated blood pressure.  1) depression: Patient is doing much better with Prozac. She's been tolerating 20 mg daily. She notes some difficulty sleeping at night but feels so much better. She denies any SI or HI. She no longer is crying at work and feels as though she is able to concentrate and relax more.  2) decreased hearing: Patient notes gradual onset of decreased hearing bilaterally. She has trouble distinguishing voices from a crowd. She has trouble sometimes motion voices on the television. She's concerned she may need hearing aids.  3) menopausal symptoms: Patient continues to experience very bothersome hot flashes. She is reluctant to consider hormone replacement therapy would like to try black cohosh instead.   No past medical history on file. Past Surgical History  Procedure Laterality Date  . Abdominal hysterectomy    . Appendectomy    . Cholecystectomy    . Foot surgery Right    Social History  Substance Use Topics  . Smoking status: Former Games developermoker  . Smokeless tobacco: Not on file  . Alcohol Use: Not on file   family history includes Cancer in her maternal grandfather and paternal uncle; Heart disease in her father; Stroke in her father.  ROS as above:  Medications: Current Outpatient Prescriptions  Medication Sig Dispense Refill  . acetaminophen (TYLENOL) 500 MG tablet Take 1,000 mg by mouth every 6 (six) hours as needed for mild pain.    . cyanocobalamin 1000 MCG tablet Take 500 mcg by mouth daily.     Marland Kitchen. FLUoxetine (PROZAC) 20 MG capsule Take 1 capsule (20 mg total) by mouth daily. 90 capsule 0  . Omega-3 Fatty Acids (FISH OIL) 1000 MG CAPS Take 1 capsule by mouth daily.      No current  facility-administered medications for this visit.   Allergies  Allergen Reactions  . Ibuprofen Rash    IV ROUTE CAN TAKE ORAL   . Sulfamethoxazole Nausea Only and Rash     Exam:  BP 146/78 mmHg  Pulse 79  Wt 221 lb (100.245 kg) Gen: Well NAD HEENT: EOMI,  MMM Normal tympanic membranes bilaterally Lungs: Normal work of breathing. CTABL Heart: RRR no MRG Abd: NABS, Soft. Nondistended, Nontender Exts: Brisk capillary refill, warm and well perfused.  Psych: Alert and oriented normal speech thought process and affect.  PHQ9 is 6 GAD 7 is 2  No results found for this or any previous visit (from the past 24 hour(s)). No results found.    Assessment and Plan: 50 y.o. female with  1) depression: Much improved. Continue Prozac. Recheck in 3 months.  2) decreased hearing: Possibly sensorineural hearing loss. Referral to audiology  3) postmenopausal symptoms: Trial of black cohosh. Recheck in 3 months. If not better would consider hormone replacement therapy.  4) elevated blood pressure: Patient has no history of hypertension. Will recheck next visit. Patient will keep home blood pressure log.  Discussed warning signs or symptoms. Please see discharge instructions. Patient expresses understanding.

## 2016-05-03 NOTE — Patient Instructions (Signed)
Thank you for coming in today. Continue prozac.  Consider taking Black cohosh. Typically two  tablets twice daily.  If you don't get better we can do hormone replacement.  We will recheck things in 3 months.  Call or go to the emergency room if you get worse, have trouble breathing, have chest pains, or palpitations.    Black Cohosh, Cimicifuga racemosa oral dosage forms What is this medicine? BLACK COHOSH (blak KOH hosh) or Cimicifuga racemosa is a dietary supplement. It is being promoted to help support female health problems, like the symptoms of menopause (hot flashes). Black cohosh is also promoted to ease menstrual pain or pre-menstrual syndrome (PMS). The FDA does not recognize black cohosh as being safe or effective for any use at this time, and warns against its use in pregnancy. This supplement may be used for other purposes; ask your health care provider or pharmacist if you have questions. This medicine may be used for other purposes; ask your health care provider or pharmacist if you have questions. What should I tell my health care provider before I take this medicine? They need to know if you have any of these conditions: -cancer -endometriosis or uterine fibroids -high blood pressure -infertility -kidney disease -liver disease -menstrual changes or irregular periods -unusual vaginal or uterine bleeding -an unusual or allergic reaction to black cohosh, soybeans, tartrazine dye (yellow dye number 5), other medicines, foods, dyes, or preservatives -pregnant or trying to get pregnant -breast-feeding How should I use this medicine? Take this herb by mouth with a glass of water. Follow the directions on the package labeling, or talk to your health care professional. Do not use for longer than 6 months without the advice of a health care professional. Do not use if you are pregnant or breast-feeding. Talk to your obstetrician-gynecologist or certified nurse-midwife. This herb  is not for use in children under the age of 18 years. Overdosage: If you think you have taken too much of this medicine contact a poison control center or emergency room at once. NOTE: This medicine is only for you. Do not share this medicine with others. What if I miss a dose? If you miss a dose, take it as soon as you can. If it is almost time for your next dose, take only that dose. Do not take double or extra doses. What may interact with this medicine? -female hormones, like estrogens or progestins and birth control pills -fertility treatments -medicines for blood pressure -medicines for diabetes This list may not describe all possible interactions. Give your health care provider a list of all the medicines, herbs, non-prescription drugs, or dietary supplements you use. Also tell them if you smoke, drink alcohol, or use illegal drugs. Some items may interact with your medicine. What should I watch for while using this medicine? Since this herb is derived from a plant, allergic reactions are possible. Stop using this herb if you develop a rash. You may need to see your health care professional, or inform them that this occurred. Report any unusual side effects promptly. If you are taking this herb for menstrual or menopausal symptoms, visit your doctor or health care professional for regular checks on your progress. You should have a complete check-up every 6 months. You will need a regular breast and pelvic exam while on this therapy. Follow the advice of your doctor or health care professional. If you have any reason to think you are pregnant, stop taking this herb at once and contact your  doctor or health care professional. Herbal or dietary supplements are not regulated like medicines. Rigid quality control standards are not required for dietary supplements. The purity and strength of these products can vary. The safety and effect of this dietary supplement for a certain disease or illness is  not well known. This product is not intended to diagnose, treat, cure or prevent any disease. The Food and Drug Administration suggests the following to help consumers protect themselves: -Always read product labels and follow directions. -Natural does not mean a product is safe for humans to take. -Look for products that include USP after the ingredient name. This means that the manufacturer followed the standards of the [place] Pharmacopoeia. -Supplements made or sold by a nationally known food or drug company are more likely to be made under tight controls. You can write to the company for more information about how the product was made. What side effects may I notice from receiving this medicine? Side effects that you should report to your doctor or health care professional as soon as possible: -allergic reactions like skin rash, itching or hives, swelling of the face, lips, or tongue -difficulty breathing, shortness of breath, or wheezing -easy bruising -fast heartbeat, slow heartbeat, or palpitations -headache -high blood pressure -severe nausea or vomiting -unusually weak or tired Side effects that usually do not require medical attention (report to your doctor or health care professional if they continue or are bothersome): -heartburn -mild upset stomach This list may not describe all possible side effects. Call your doctor for medical advice about side effects. You may report side effects to FDA at 1-800-FDA-1088. Where should I keep my medicine? Keep out of the reach of children. Store at room temperature between 15 and 30 degrees C (59 and 86 degrees C). Throw away any unused herb after the expiration date. NOTE: This sheet is a summary. It may not cover all possible information. If you have questions about this medicine, talk to your doctor, pharmacist, or health care provider.    2016, Elsevier/Gold Standard. (2008-02-25 13:29:09)

## 2016-07-18 ENCOUNTER — Ambulatory Visit (INDEPENDENT_AMBULATORY_CARE_PROVIDER_SITE_OTHER): Payer: Commercial Managed Care - PPO | Admitting: Family Medicine

## 2016-07-18 VITALS — BP 132/85 | HR 97 | Wt 214.0 lb

## 2016-07-18 DIAGNOSIS — F411 Generalized anxiety disorder: Secondary | ICD-10-CM

## 2016-07-18 DIAGNOSIS — F332 Major depressive disorder, recurrent severe without psychotic features: Secondary | ICD-10-CM

## 2016-07-18 MED ORDER — BUPROPION HCL ER (XL) 150 MG PO TB24: 150.0000 mg | ORAL_TABLET | ORAL | 1 refills | Status: DC

## 2016-07-18 MED ORDER — FLUOXETINE HCL 40 MG PO CAPS: 40.0000 mg | ORAL_CAPSULE | Freq: Every day | ORAL | 0 refills | Status: DC

## 2016-07-18 NOTE — Patient Instructions (Signed)
Thank you for coming in today. Return in 1 week.  Return sooner if needed.  Call me if you get worse.  Increase prozac to 40mg  daily.  Add Wellbutrin.  You should hear about psychiatry and counseling soon  Helping Someone Who is Suicidal Suicide is when someone takes his or her own life. Someone who is thinking about suicide needs immediate help. Although you might not know what to say or do to help, start by letting that person know you care. Listen to him or her. Then talk about how to get help. Help is available through therapy, medicine, and other treatments. WHAT ARE SIGNS THAT SOMEONE IS SUICIDAL? Common signs include:   Signs of depression, such as:  Rage.  Irritability.  Shame.  Excessive worry.  Loss of interest in things the person once enjoyed.  Changes in social behaviors and relationships, including:  Isolating oneself.  Withdrawing from friends and family.  Giving away possessions.  Saying good-bye.  Acting aggressively.  Sleeping more or less than usual.  Having trouble managing school or work.   Talking about feeling hopeless or being a burden.  Engaging in risky behaviors, such as drinking more alcohol or using more drugs. WHAT ARE THE RISK FACTORS FOR SUICIDE? Risk factors for suicide include:   Other suicides in the family.  A history of suicide attempts.  Depression or other mental health issues.  Being in jail or facing jail time.  Having had close friends who have committed suicide.  Alcohol or drug abuse, especially combined with a mental illness.  WHAT SHOULD I DO IF SOMEONE IS SUICIDAL? If you believe a person is in immediate danger of committing suicide, call your local emergency services (911 in the [place] for help. If a person says he or she wants to commit suicide, take the threat seriously. Help the person get help right away by:   Calling your local emergency services.  Calling a suicide prevention hotline.  Contacting  a crisis center or a local suicide prevention center. These are often located at hospitals, clinics, [employer] service organizations, social service providers, or health departments. If a person confides in you that he or she is considering suicide:   Listen to the person's thoughts and concerns with compassion.  Let the person know you will stay with him or her.   Ask if the person is having thoughts of hurting himself or herself.   Offer to help the person get to a doctor or mental health professional.   Remove all weapons and medicines from the person's living space.  Do not promise to keep his or her thoughts of suicide a secret.   This information is not intended to replace advice given to you by your health care provider. Make sure you discuss any questions you have with your health care provider.   Document Released: 06/01/2003 Document Revised: 12/16/2014 Document Reviewed: 05/05/2014 Elsevier Interactive Patient Education [employer]2016 Elsevier Inc.

## 2016-07-19 DIAGNOSIS — F411 Generalized anxiety disorder: Secondary | ICD-10-CM | POA: Insufficient documentation

## 2016-07-19 NOTE — Progress Notes (Signed)
Susan Mcintosh is a 50 y.o. female who presents to [name]: Primary Care Sports Medicine today for new depression and anxiety. Patient has worsening depression and anxiety symptoms of the past several months. She notes profound anhedonia, feeling depressed difficulty with sleep having little energy and difficulty concentrating. She has a passive death wish. She thinks she would take pills to overdose for suicide attempt but has no active plan. Her daughter and granddaughter are what is keeping her from hurting herself. She is very concerned about her suicidal thoughts and is here for health. She continues to take Prozac 20 mg daily.   No past medical history on file. Past Surgical History:  Procedure Laterality Date  . ABDOMINAL HYSTERECTOMY    . APPENDECTOMY    . CHOLECYSTECTOMY    . FOOT SURGERY Right    Social History  Substance Use Topics  . Smoking status: Former Games developer  . Smokeless tobacco: Not on file  . Alcohol use Not on file   family history includes Cancer in her maternal grandfather and paternal uncle; Heart disease in her father; Stroke in her father.  ROS as above:  Medications: Current Outpatient Prescriptions  Medication Sig Dispense Refill  . acetaminophen (TYLENOL) 500 MG tablet Take 1,000 mg by mouth every 6 (six) hours as needed for mild pain.    Marland Kitchen buPROPion (WELLBUTRIN XL) 150 MG 24 hr tablet Take 1 tablet (150 mg total) by mouth every morning. 30 tablet 1  . cyanocobalamin 1000 MCG tablet Take 500 mcg by mouth daily.     Marland Kitchen FLUoxetine (PROZAC) 40 MG capsule Take 1 capsule (40 mg total) by mouth daily. 30 capsule 0  . Omega-3 Fatty Acids (FISH OIL) 1000 MG CAPS Take 1 capsule by mouth daily.      No current facility-administered medications for this visit.    Allergies  Allergen Reactions  . Ibuprofen Rash    IV ROUTE CAN TAKE ORAL   . Sulfamethoxazole Nausea  Only and Rash     Exam:  BP 132/85   Pulse 97   Wt 214 lb (97.1 kg)   BMI 33.52 kg/m  Gen: Well NAD Psych: Alert and oriented affect is tearful. Thought process is linear and goal-directed. Patient has a passive death wish with no homicidal ideation. She agrees to contract for safety today. Depression screen PHQ 2/9 07/19/2016  Decreased Interest 2  Down, Depressed, Hopeless 3  PHQ - 2 Score 5  Altered sleeping 3  Tired, decreased energy 3  Change in appetite 3  Feeling bad or failure about yourself  3  Trouble concentrating 3  Moving slowly or fidgety/restless 1  Suicidal thoughts 3  PHQ-9 Score 24    GAD 7 : Generalized Anxiety Score 07/19/2016  Nervous, Anxious, on Edge 3  Control/stop worrying 3  Worry too much - different things 3  Trouble relaxing 3  Restless 1  Easily annoyed or irritable 2  Afraid - awful might happen 3  Total GAD 7 Score 18  Anxiety Difficulty Very difficult      No results found for this or any previous visit (from the past 24 hour(s)). No results found.    Assessment and Plan: 50 y.o. female with major depression and anxiety worsening. Patient is a passive death wish. Plan for contract for safety. Please see document scanned into chart. Additionally will increase Prozac to 40 mg daily and start Wellbutrin. Refer to counseling and psychiatry. Recheck in 1 week.  Suicidal precautions reviewed.   No orders of the defined types were placed in this encounter.   Discussed warning signs or symptoms. Please see discharge instructions. Patient expresses understanding.

## 2016-07-26 ENCOUNTER — Ambulatory Visit (INDEPENDENT_AMBULATORY_CARE_PROVIDER_SITE_OTHER): Payer: Commercial Managed Care - PPO | Admitting: Family Medicine

## 2016-07-26 ENCOUNTER — Encounter: Payer: Self-pay | Admitting: Family Medicine

## 2016-07-26 VITALS — BP 122/82 | HR 83 | Temp 98.2°F | Resp 16 | Ht 67.5 in | Wt 213.0 lb

## 2016-07-26 DIAGNOSIS — Z23 Encounter for immunization: Secondary | ICD-10-CM

## 2016-07-26 DIAGNOSIS — F332 Major depressive disorder, recurrent severe without psychotic features: Secondary | ICD-10-CM | POA: Diagnosis not present

## 2016-07-26 NOTE — Progress Notes (Signed)
       Susan AlandKristie Mcintosh is a 50 y.o. female who [name] today for follow-up depression. Patient was seen about a week ago for exacerbation of her major depression. She was started on Wellbutrin on her Prozac was increased to 40 mg recheck refer to counseling and psychiatry. She has not heard from counseling psychiatry yet. She notes feeling much better and no longer has any suicidal ideation.   No past medical history on file. Past Surgical History:  Procedure Laterality Date  . ABDOMINAL HYSTERECTOMY    . APPENDECTOMY    . CHOLECYSTECTOMY    . FOOT SURGERY Right    Social History  Substance Use Topics  . Smoking status: Former Games developermoker  . Smokeless tobacco: Not on file  . Alcohol use Not on file   family history includes Cancer in her maternal grandfather and paternal uncle; Heart disease in her father; Stroke in her father.  ROS as above:  Medications: Current Outpatient Prescriptions  Medication Sig Dispense Refill  . acetaminophen (TYLENOL) 500 MG tablet Take 1,000 mg by mouth every 6 (six) hours as needed for mild pain.    Marland Kitchen. buPROPion (WELLBUTRIN XL) 150 MG 24 hr tablet Take 1 tablet (150 mg total) by mouth every morning. 30 tablet 1  . cyanocobalamin 1000 MCG tablet Take 500 mcg by mouth daily.     Marland Kitchen. FLUoxetine (PROZAC) 40 MG capsule Take 1 capsule (40 mg total) by mouth daily. 30 capsule 0  . naproxen (NAPROSYN) 500 MG tablet Take 500 mg by mouth.    . Omega-3 Fatty Acids (FISH OIL) 1000 MG CAPS Take 1 capsule by mouth daily.      No current facility-administered medications for this visit.    Allergies  Allergen Reactions  . Ibuprofen Rash    IV ROUTE CAN TAKE ORAL   . Sulfamethoxazole Nausea Only and Rash     Exam:  BP 122/82 (BP Location: Right Arm, Patient Position: Sitting, Cuff Size: Large)   Pulse 83   Temp 98.2 F (36.8 C)  (Oral)   Resp 16   Ht 5' 7.5" (1.715 m)   Wt 213 lb (96.6 kg)   SpO2 97%   BMI 32.87 kg/m  Gen: Well NAD HEENT: EOMI,  MMM Lungs: Normal work of breathing. CTABL Heart: RRR no MRG Abd: NABS, Soft. Nondistended, Nontender Exts: Brisk capillary refill, warm and well perfused.   [facility] 07/19/2016  Decreased Interest 1 2  Down, Depressed, Hopeless 1 3  PHQ - 2 Score 2 5  Altered sleeping 1 3  Tired, decreased energy 1 3  Change in appetite 1 3  Feeling bad or failure about yourself  1 3  Trouble concentrating 1 3  Moving slowly or fidgety/restless 1 1  Suicidal thoughts 0 3  PHQ-9 Score 8 24     No results found for this or any previous visit (from the past 24 hour(s)). No results found.    Assessment and Plan: 50 y.o. female with improved major depression. Continue current regimen. Recheck in one month.  Flu vaccine given prior to discharge.  No orders of the defined types were placed in this encounter.   Discussed warning signs or symptoms. Please see discharge instructions. Patient expresses understanding.

## 2016-07-26 NOTE — Patient Instructions (Signed)
Thank you for coming in today. Continue your medicine.  Recheck in 1 month.

## 2016-08-02 ENCOUNTER — Ambulatory Visit: Payer: Commercial Managed Care - PPO | Admitting: Family Medicine

## 2016-08-16 ENCOUNTER — Ambulatory Visit: Payer: Commercial Managed Care - PPO | Admitting: Family Medicine

## 2016-08-23 ENCOUNTER — Ambulatory Visit (HOSPITAL_COMMUNITY): Payer: Commercial Managed Care - PPO | Admitting: Licensed Clinical Social Worker

## 2016-09-16 ENCOUNTER — Other Ambulatory Visit: Payer: Self-pay | Admitting: Family Medicine

## 2016-09-16 NOTE — Telephone Encounter (Signed)
Called pt check on her to discuss missed appointments and medication refills. Pt states that she is doing well and had been in [place] with family. Advised pt that 30 day refills would be sent to the pharmacy and requested a call back to schedule FU appt. Pt verbalized understaning.

## 2016-10-18 ENCOUNTER — Other Ambulatory Visit: Payer: Self-pay

## 2016-10-18 MED ORDER — BUPROPION HCL ER (XL) 150 MG PO TB24: 150.0000 mg | ORAL_TABLET | Freq: Every morning | ORAL | 1 refills | Status: DC

## 2016-10-18 MED ORDER — FLUOXETINE HCL 40 MG PO CAPS: 40.0000 mg | ORAL_CAPSULE | Freq: Every day | ORAL | 1 refills | Status: DC

## 2016-10-18 NOTE — Telephone Encounter (Signed)
Susan BadKristie called and [place]. She has started a new job and will not have insurance for 100 days. She states she can not afford to establish care with a new doctor in [place]. She is wanting refills on Bupropion 150 mg once daily and Fluoxetine 40 mg once daily for 90 days plus one refill. She states the mediations work really well and she is emotionally very well. Please advise.  Added [facility] [place] pharmacy

## 2017-02-14 IMAGING — CR DG HIP (WITH OR WITHOUT PELVIS) 2-3V*R*
3 series · 3 of 3 positions shown · non-contrast
Comparison: None.

CLINICAL DATA: Right hip pain since November 2015 partial
hysterectomy surgery.

EXAM:
DG HIP (WITH OR WITHOUT PELVIS) 2-3V RIGHT

[hip ap]
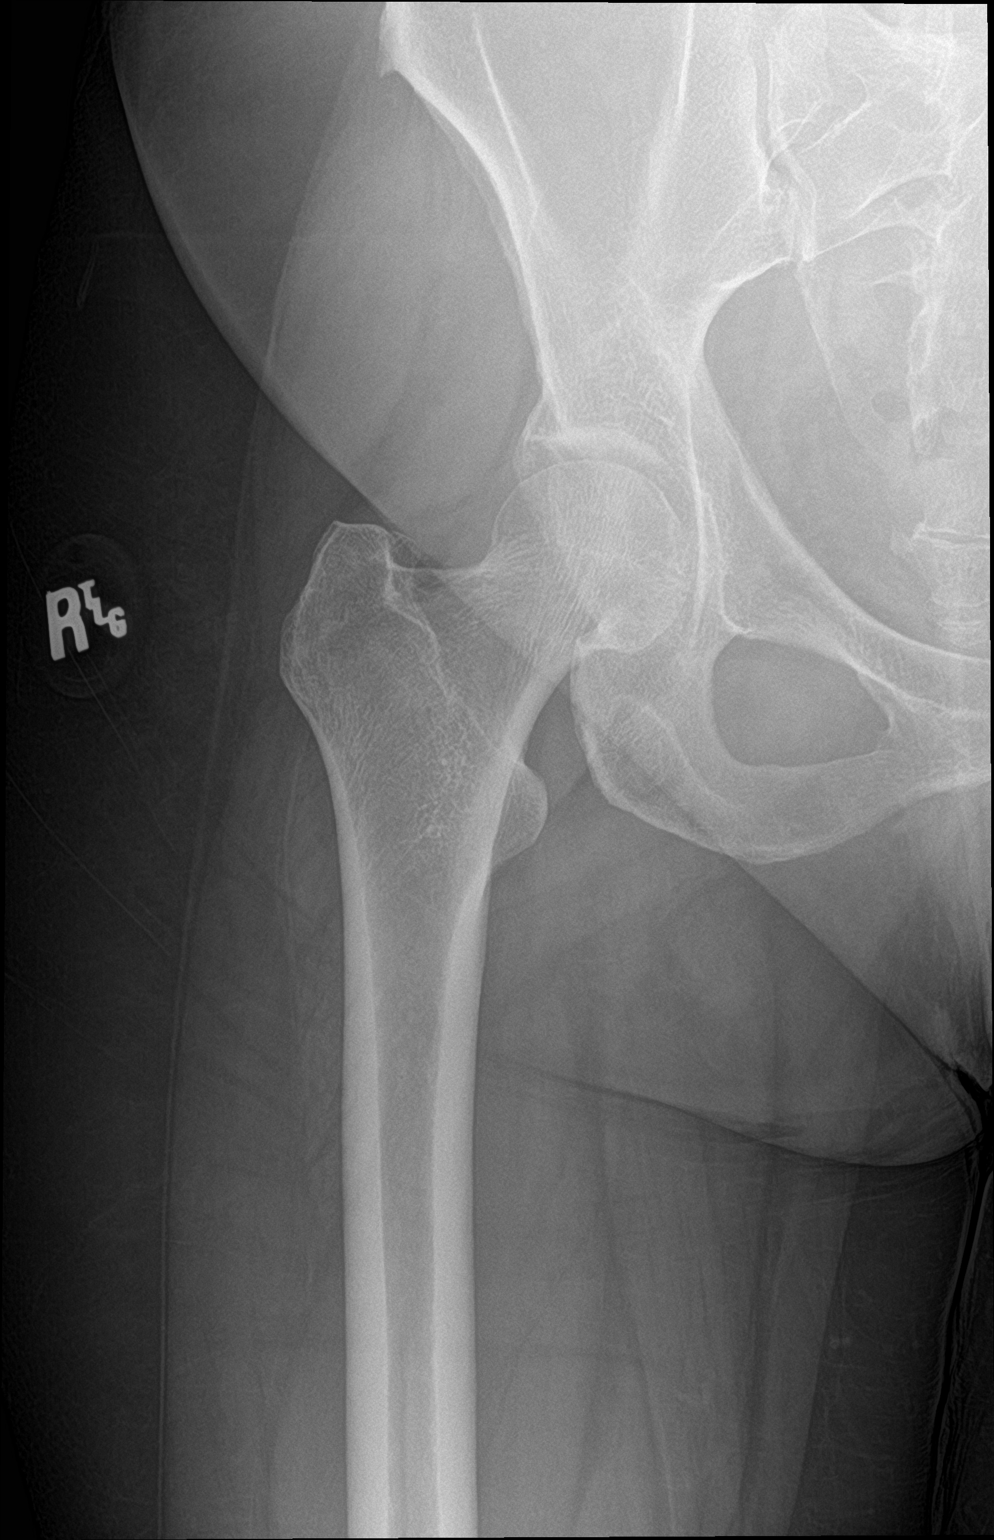

[pelvis ap]
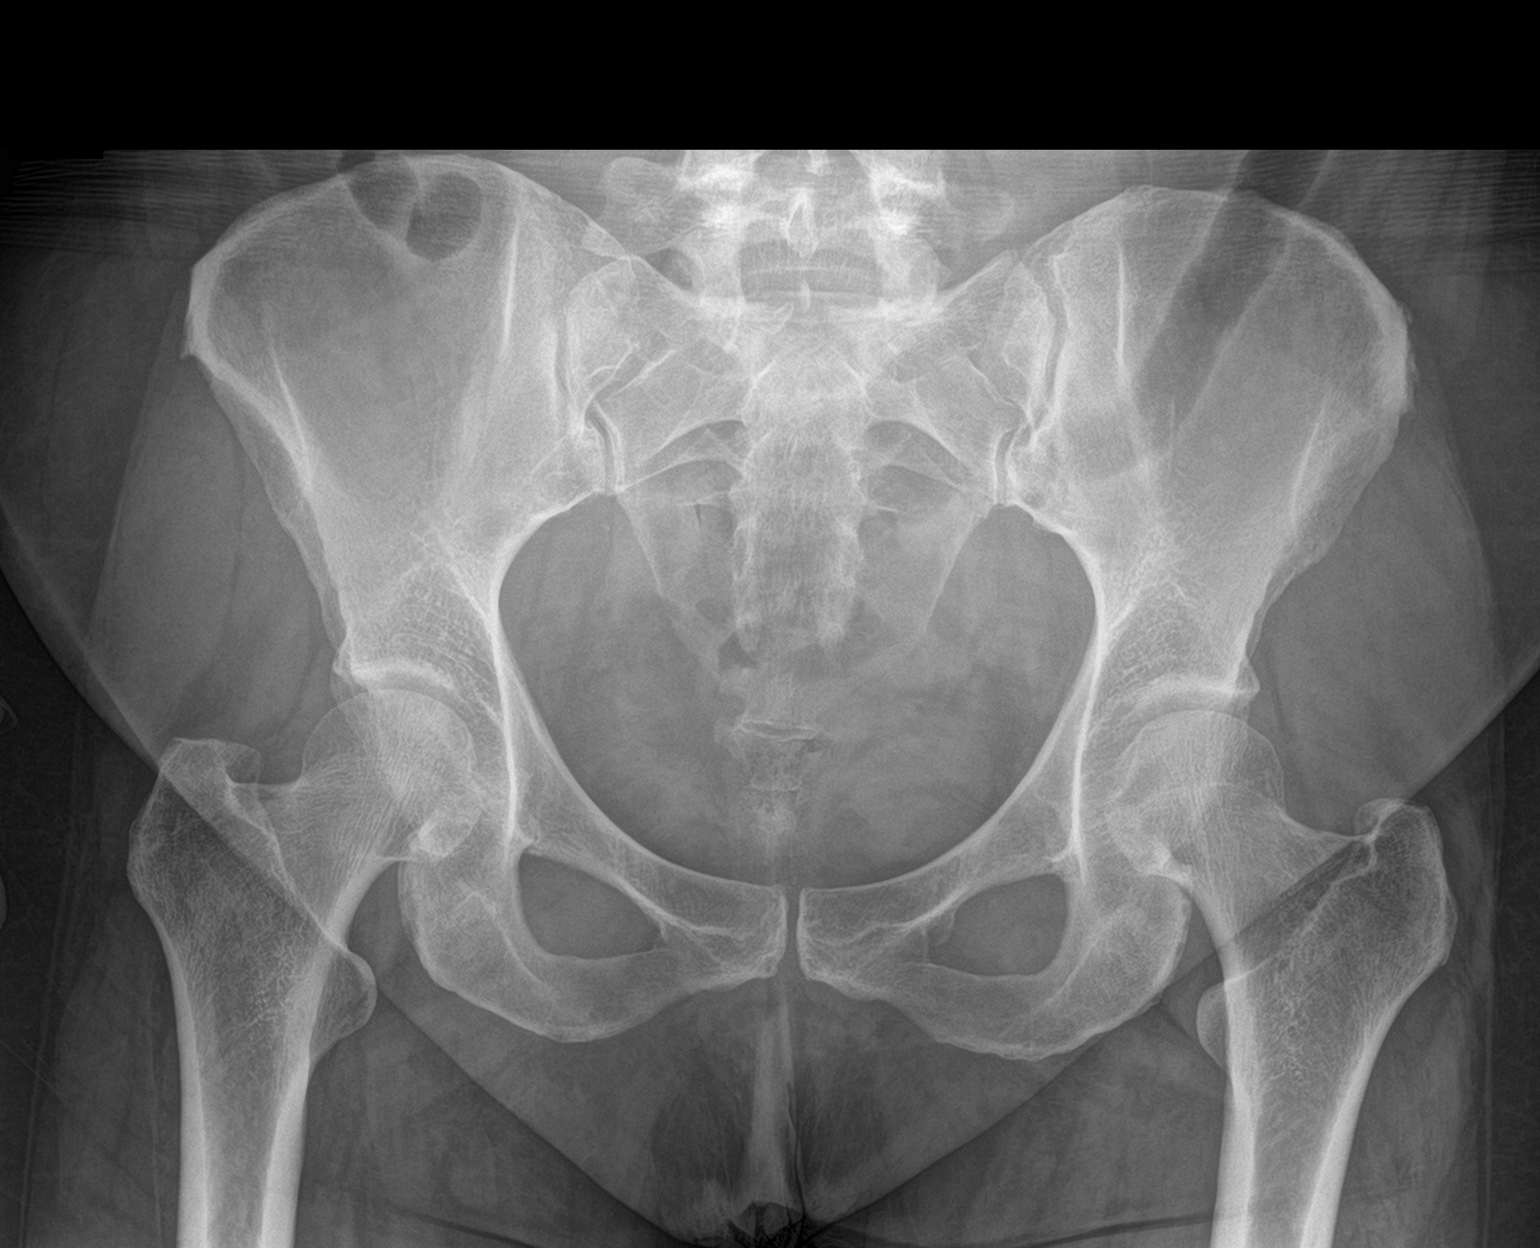

[hip frog leg]
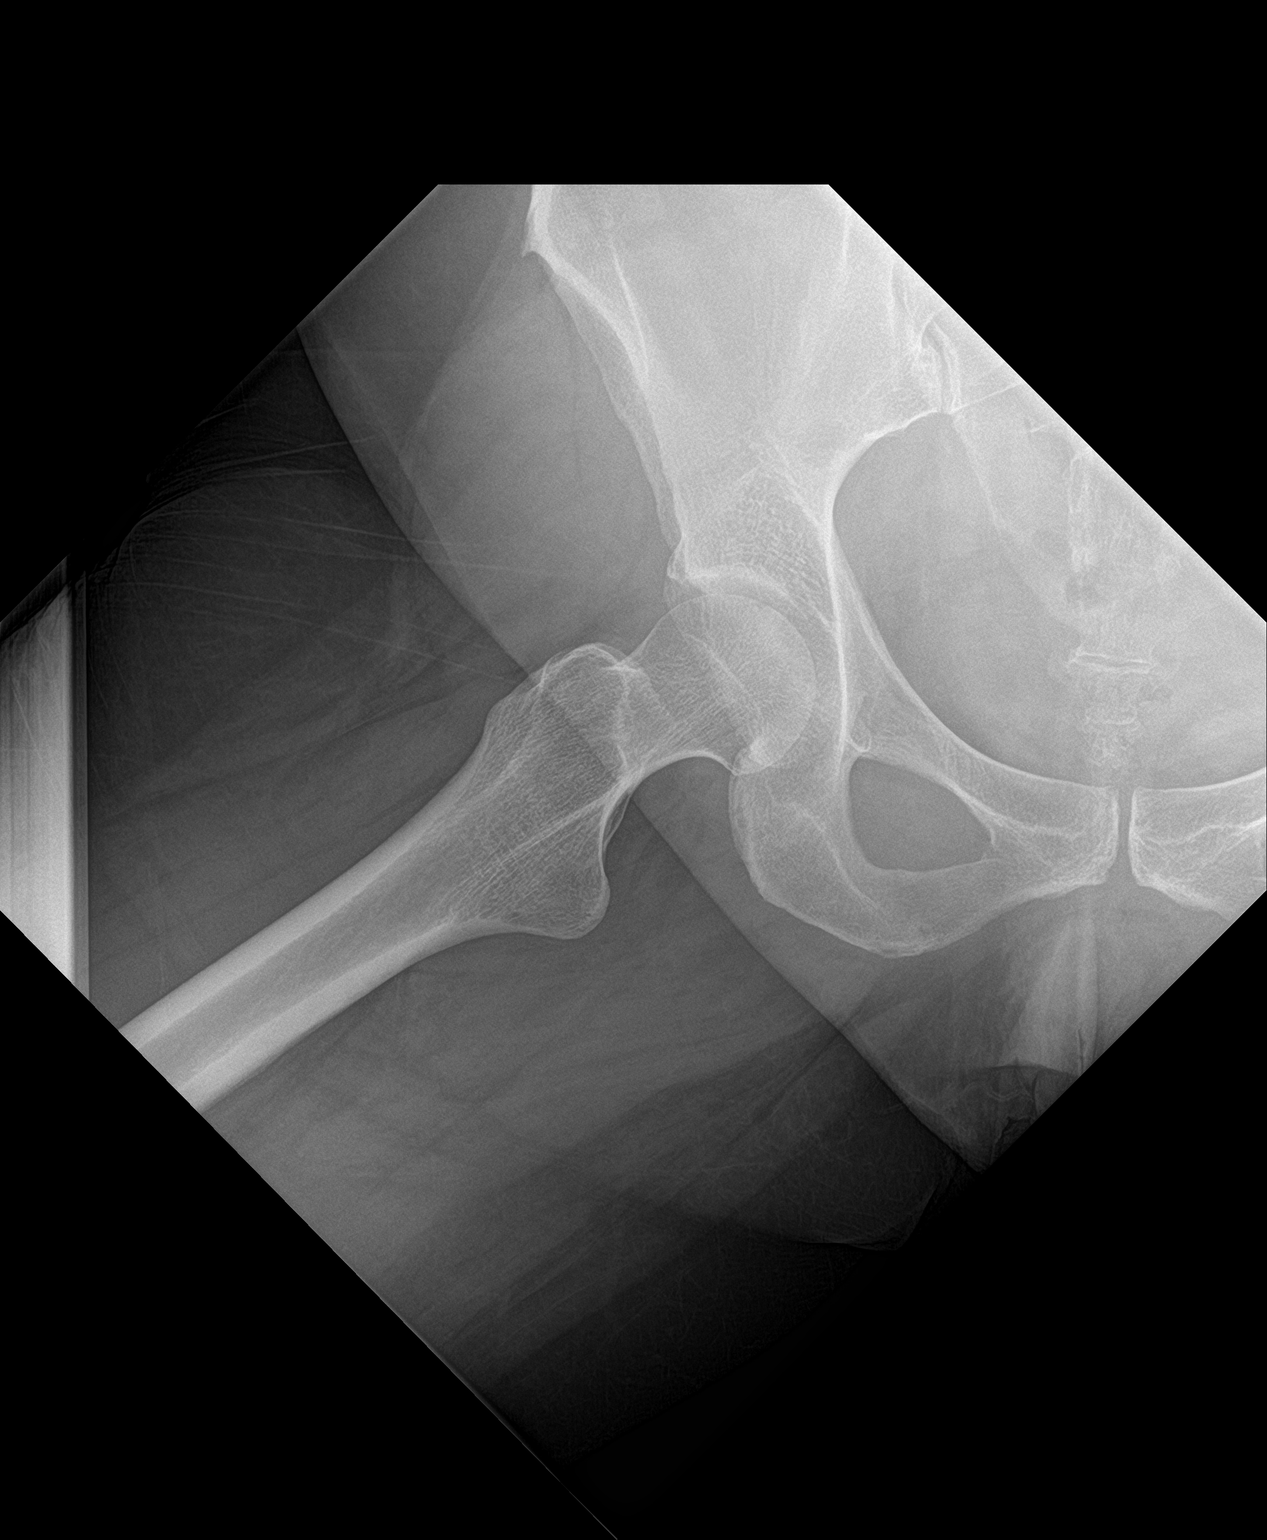

[3 of 3 positions shown; findings below may reference images not displayed]

FINDINGS: There is no evidence of hip fracture or dislocation. There is no
evidence of arthropathy or other focal bone abnormality.
IMPRESSION: Negative.

## 2017-02-18 IMAGING — CT CT CERVICAL SPINE W/O CM
3 of 6 series · 10 of 33 positions shown, 12 images · non-contrast
Comparison: None.

CLINICAL DATA: Headache for 2 weeks with neck pain

EXAM:
CT HEAD WITHOUT CONTRAST
CT CERVICAL SPINE WITHOUT CONTRAST
TECHNIQUE: Multidetector CT imaging of the head and cervical spine was
performed following the standard protocol without intravenous
contrast. Multiplanar CT image reconstructions of the cervical spine
were also generated.

[Series 307: orthgonal · axial · 0.35mm/px · z∈[+56,+112]mm · 2 of 92 slices shown, 3 images]
[im 31/92  soft-tissue]
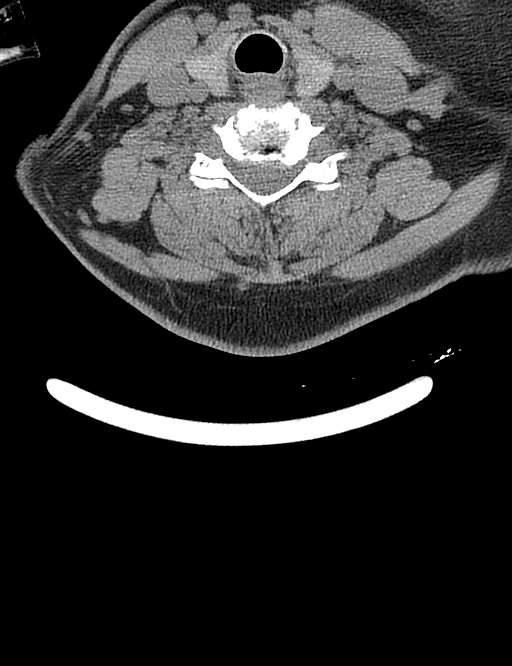
[im 31/92  bone]
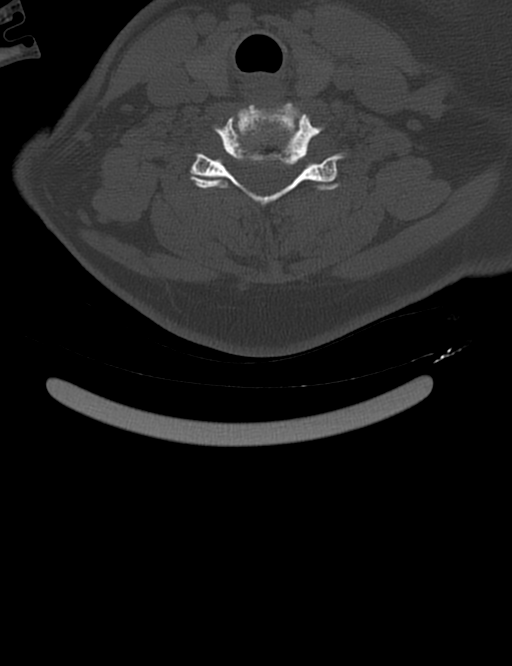
[im 61/92  bone]
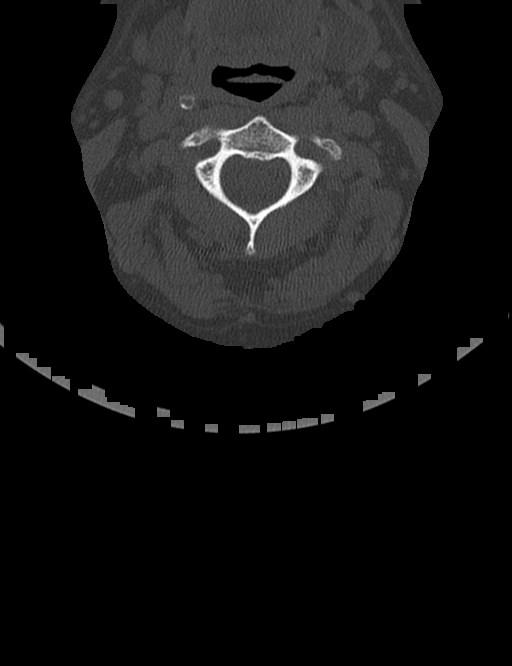

[Series 308: coronal · coronal · 0.35mm/px · 3 of 53 slices shown]
[im 11/53  bone]
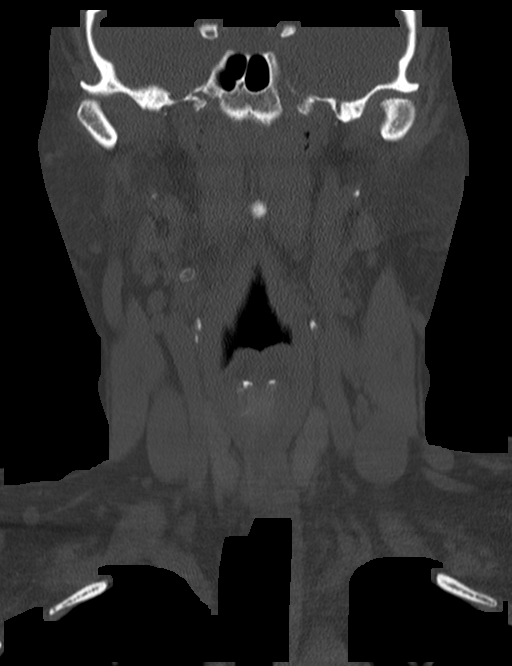
[im 21/53  bone]
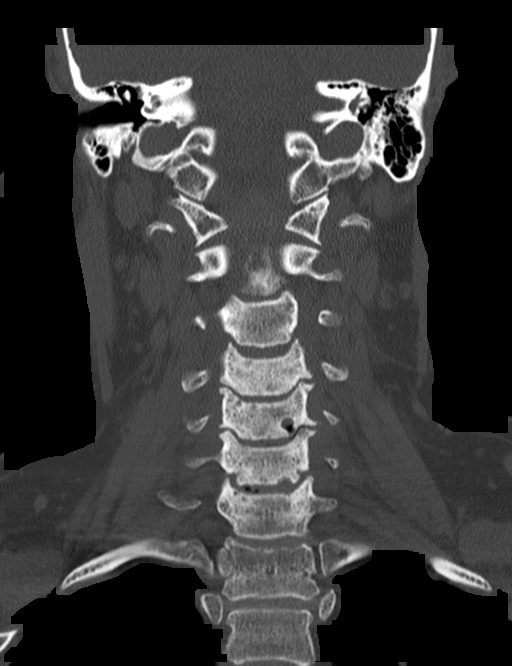
[im 32/53  bone]
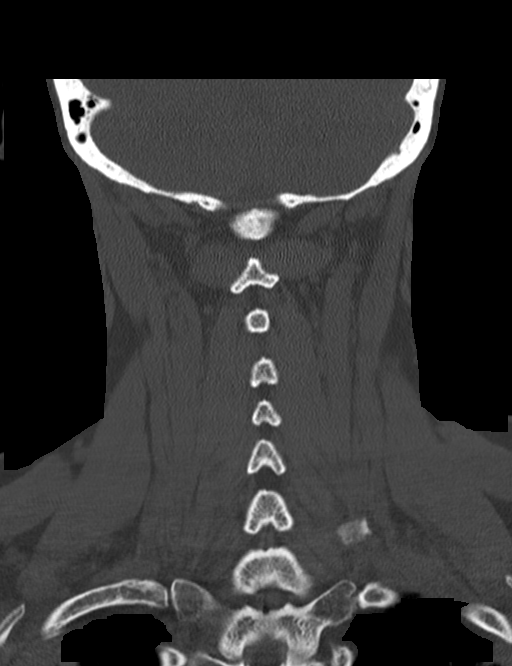

[Series 309: sagittal · sagittal · 0.35mm/px · 5 of 47 slices shown, 6 images]
[im 16/47  bone]
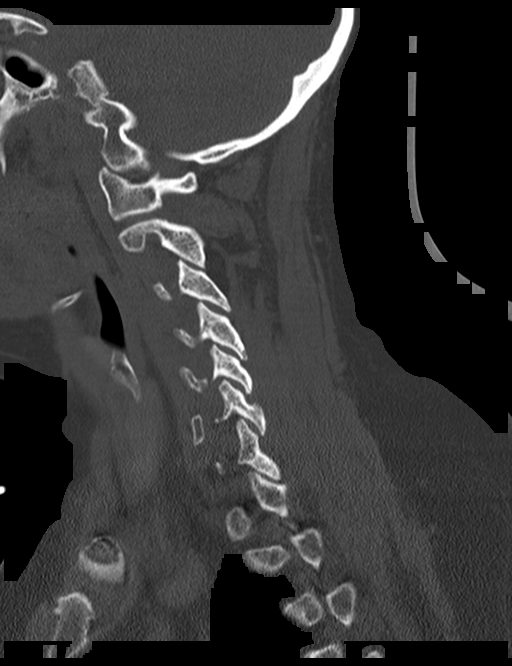
[im 20/47  bone]
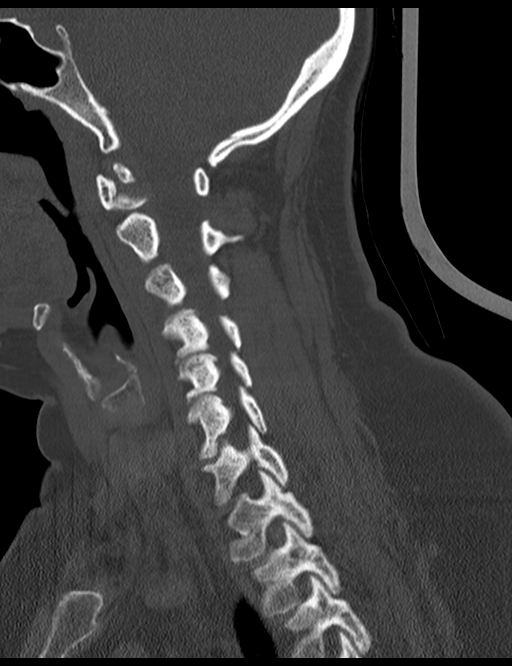
[im 24/47  soft-tissue]
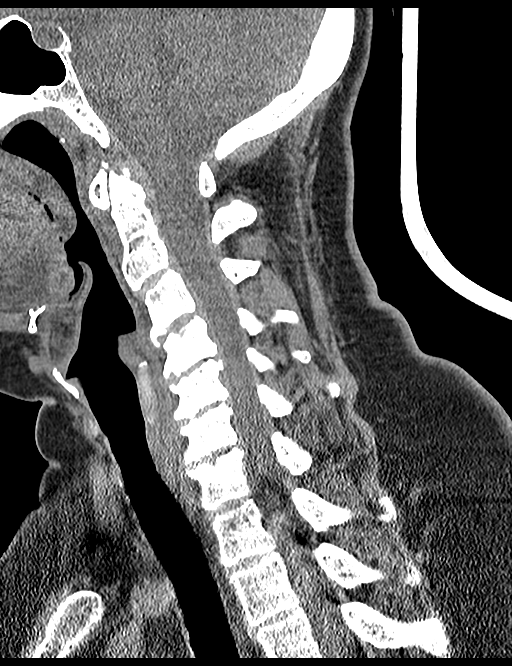
[im 24/47  bone]
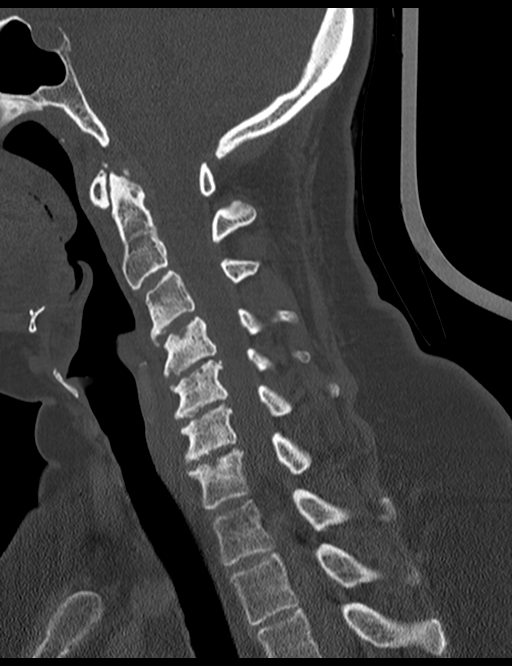
[im 27/47  bone]
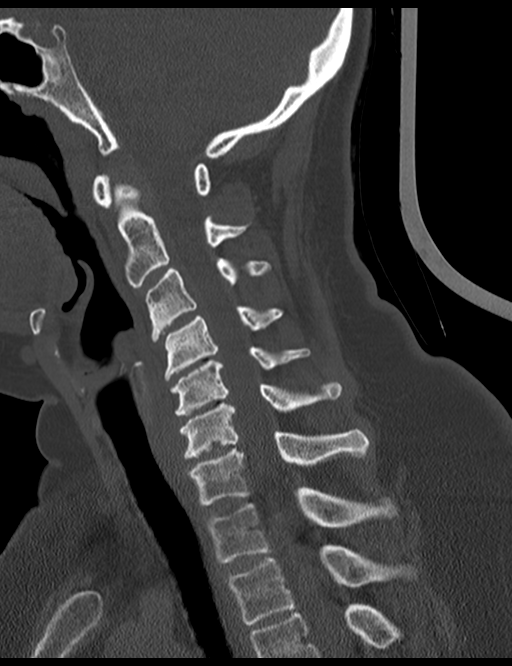
[im 31/47  bone]
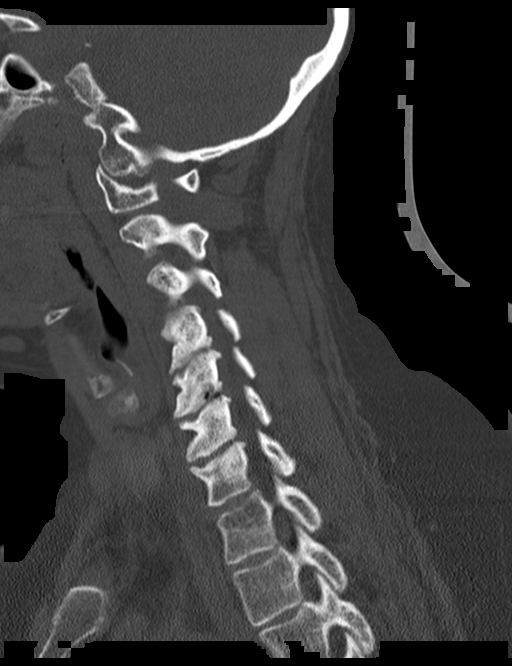

[10 of 33 positions shown; findings below may reference images not displayed]

FINDINGS: CT HEAD FINDINGS

There is no evidence of mass effect, midline shift or extra-axial
fluid collections. There is no evidence of a space-occupying lesion
or intracranial hemorrhage. There is no evidence of a cortical-based
area of acute infarction.

The ventricles and sulci are appropriate for the patient's age. The
basal cisterns are patent.

Visualized portions of the orbits are unremarkable. The visualized
portions of the paranasal sinuses and mastoid air cells are
unremarkable.

The osseous structures are unremarkable.

CT CERVICAL SPINE FINDINGS

The alignment is anatomic. The vertebral body heights are
maintained. There is no acute fracture. There is no static
listhesis. The prevertebral soft tissues are normal. The intraspinal
soft tissues are not fully imaged on this examination due to poor
soft tissue contrast, but there is no gross soft tissue abnormality.
There is an accessory ossicle adjacent to the tip of the odontoid.

There is degenerative disc disease with disc height loss at C4-5,
C5-6 and C6-7. There bilateral uncovertebral degenerative changes at
C4-5 and C5-6. There is a mild broad-based disc bulge C6-7.

The visualized portions of the lung apices demonstrate no focal
abnormality.
IMPRESSION: 1. No acute intracranial pathology.
2. No acute osseous injury of the cervical spine.

## 2017-06-03 ENCOUNTER — Other Ambulatory Visit: Payer: Self-pay | Admitting: Family Medicine

## 2017-07-21 ENCOUNTER — Other Ambulatory Visit: Payer: Self-pay | Admitting: Family Medicine
